# Patient Record
Sex: Male | Born: 2000 | Race: Black or African American | Hispanic: No | Marital: Single | Smoking: Never smoker
Health system: Southern US, Community
[De-identification: ages and names within clinical notes are randomized; demographics above are authoritative.]

---
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
## 2020-07-21 ENCOUNTER — Encounter: Payer: Self-pay | Admitting: Emergency Medicine

## 2020-07-21 ENCOUNTER — Other Ambulatory Visit: Payer: Self-pay

## 2020-07-21 ENCOUNTER — Ambulatory Visit
Admission: EM | Admit: 2020-07-21 | Discharge: 2020-07-21 | Disposition: A | Discharge status: Home / Self Care | Payer: Medicaid Other | Attending: Family Medicine | Admitting: Family Medicine

## 2020-07-21 DIAGNOSIS — L237 Allergic contact dermatitis due to plants, except food: Secondary | ICD-10-CM | POA: Diagnosis not present

## 2020-07-21 MED ORDER — TRIAMCINOLONE ACETONIDE 0.025 % EX OINT
1.0000 | TOPICAL_OINTMENT | Freq: Two times a day (BID) | CUTANEOUS | 0 refills | Status: AC
Start: 2020-07-21 — End: ?

## 2020-07-21 MED ORDER — PREDNISONE 20 MG PO TABS: 20.0000 mg | ORAL_TABLET | Freq: Every day | ORAL | 0 refills | Status: AC

## 2020-07-21 NOTE — ED Triage Notes (Signed)
Itchy rash on arms and torso after working outside x 3-4 days ago

## 2020-07-21 NOTE — ED Provider Notes (Signed)
[facility]    CSN: [id number] Arrival date & time: 07/21/20  1432      History   Chief Complaint Chief Complaint  Patient presents with   Rash    HPI Aaron Harris is a 20 y.o. male.   HPI Patient presents today with a itchy rash involving bilateral upper extremities and his torso.  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  He has been using over-the-counter calamine lotion without relief of itchiness.  He takes cetirizine daily for allergy management.  Denies any rash involving the eyes or mouth.  History reviewed. No pertinent past medical history.  There are no problems to display for this patient.   History reviewed. No pertinent surgical history.     Home Medications    Prior to Admission medications   Medication Sig Start Date End Date Taking? Authorizing Provider  predniSONE (DELTASONE) 20 MG tablet Take 1 tablet (20 mg total) by mouth daily with breakfast for 5 days. 07/21/20 07/26/20 Yes Bing Neighbors, FNP  triamcinolone (KENALOG) 0.025 % ointment Apply 1 application topically 2 (two) times daily. 07/21/20  Yes Bing Neighbors, FNP    Family History No family history on file.  Social History Social History   Tobacco Use   Smoking status: Never   Smokeless tobacco: Never  Substance Use Topics   Alcohol use: Never   Drug use: Never     Allergies   Patient has no known allergies.   Review of Systems Review of Systems Pertinent negatives listed in HPI   Physical Exam Triage Vital Signs ED Triage Vitals [07/21/20 1533]  Enc Vitals Group     BP 114/65     Pulse Rate 61     Resp 17     Temp 98.3 F (36.8 C)     Temp Source Oral     SpO2 97 %     Weight      Height      Head Circumference      Peak Flow      Pain Score 0     Pain Loc      Pain Edu?      Excl. in GC?    No data found.  Updated Vital Signs BP 114/65 (BP Location: Right Arm)   Pulse 61   Temp 98.3 F (36.8 C)  (Oral)   Resp 17   SpO2 97%   Visual Acuity Right Eye Distance:   Left Eye Distance:   Bilateral Distance:    Right Eye Near:   Left Eye Near:    Bilateral Near:     Physical Exam Constitutional:      Appearance: Normal appearance.  Cardiovascular:     Rate and Rhythm: Normal rate and regular rhythm.  Pulmonary:     Effort: Pulmonary effort is normal.  Skin:    Capillary Refill: Capillary refill takes less than 2 seconds.     Findings: Rash present. Rash is macular.  Neurological:     General: No focal deficit present.     Mental Status: He is alert.  Psychiatric:        Mood and Affect: Mood normal.     UC Treatments / Results  Labs (all labs ordered are listed, but only abnormal results are displayed) Labs Reviewed - No data to display  EKG   Radiology No results found.  Procedures Procedures (including critical care time)  Medications Ordered in UC Medications -  No data to display  Initial Impression / Assessment and Plan / UC Course  I have reviewed the triage vital signs and the nursing notes.  Pertinent labs & imaging results that were available during my care of the patient were reviewed by me and considered in my medical decision making (see chart for details).    Poison ivy dermatitis, patient declined IM injection we will treat with oral prednisone 20 mg once daily for 5 days.  Triamcinolone cream to be applied twice daily as needed to rash to resolve rash.  Encouraged to continue cetirizine.  Return precautions given.  Final Clinical Impressions(s) / UC Diagnoses   Final diagnoses:  Poison ivy dermatitis   Discharge Instructions   None    ED Prescriptions     Medication Sig Dispense Auth. Provider   predniSONE (DELTASONE) 20 MG tablet Take 1 tablet (20 mg total) by mouth daily with breakfast for 5 days. 5 tablet Bing Neighbors, FNP   triamcinolone (KENALOG) 0.025 % ointment Apply 1 application topically 2 (two) times daily. 454 g  Bing Neighbors, FNP      PDMP not reviewed this encounter.   Bing Neighbors, FNP 07/21/20 1625

## 2020-10-01 DIAGNOSIS — T63441A Toxic effect of venom of bees, accidental (unintentional), initial encounter: Secondary | ICD-10-CM | POA: Diagnosis not present

## 2021-02-02 DIAGNOSIS — R369 Urethral discharge, unspecified: Secondary | ICD-10-CM | POA: Diagnosis not present

## 2021-02-02 DIAGNOSIS — Z113 Encounter for screening for infections with a predominantly sexual mode of transmission: Secondary | ICD-10-CM | POA: Diagnosis not present

## 2021-02-02 DIAGNOSIS — N50819 Testicular pain, unspecified: Secondary | ICD-10-CM | POA: Diagnosis not present

## 2021-02-02 DIAGNOSIS — R350 Frequency of micturition: Secondary | ICD-10-CM | POA: Diagnosis not present

## 2021-10-15 DIAGNOSIS — B349 Viral infection, unspecified: Secondary | ICD-10-CM | POA: Diagnosis not present

## 2022-06-09 DIAGNOSIS — J029 Acute pharyngitis, unspecified: Secondary | ICD-10-CM | POA: Diagnosis not present

## 2023-03-28 DIAGNOSIS — J029 Acute pharyngitis, unspecified: Secondary | ICD-10-CM | POA: Diagnosis not present
